# Patient Record
Sex: Female | Born: 1980 | Race: White | Hispanic: Yes | Marital: Married | Smoking: Current every day smoker
Health system: Southern US, Community
[De-identification: ages and names within clinical notes are randomized; demographics above are authoritative.]

---
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
## 1998-02-22 ENCOUNTER — Inpatient Hospital Stay (HOSPITAL_COMMUNITY): Admission: RE | Admit: 1998-02-22 | Discharge: 1998-02-22 | Payer: Self-pay | Admitting: *Deleted

## 1998-03-01 ENCOUNTER — Ambulatory Visit (HOSPITAL_COMMUNITY): Admission: RE | Admit: 1998-03-01 | Discharge: 1998-03-01 | Payer: Self-pay | Admitting: Obstetrics

## 1998-03-08 ENCOUNTER — Inpatient Hospital Stay (HOSPITAL_COMMUNITY): Admission: RE | Admit: 1998-03-08 | Discharge: 1998-03-13 | Payer: Self-pay | Admitting: Obstetrics & Gynecology

## 1998-03-13 ENCOUNTER — Encounter: Payer: Self-pay | Admitting: Obstetrics & Gynecology

## 1998-03-16 ENCOUNTER — Encounter (HOSPITAL_COMMUNITY): Admission: RE | Admit: 1998-03-16 | Discharge: 1998-03-30 | Payer: Self-pay | Admitting: *Deleted

## 1998-03-23 ENCOUNTER — Encounter: Payer: Self-pay | Admitting: *Deleted

## 1998-03-26 ENCOUNTER — Inpatient Hospital Stay (HOSPITAL_COMMUNITY): Admission: AD | Admit: 1998-03-26 | Discharge: 1998-03-26 | Payer: Self-pay | Admitting: *Deleted

## 1998-03-30 ENCOUNTER — Inpatient Hospital Stay (HOSPITAL_COMMUNITY): Admission: AD | Admit: 1998-03-30 | Discharge: 1998-04-03 | Payer: Self-pay | Admitting: Obstetrics

## 1998-04-03 ENCOUNTER — Encounter (HOSPITAL_COMMUNITY): Admission: RE | Admit: 1998-04-03 | Discharge: 1998-07-02 | Payer: Self-pay | Admitting: *Deleted

## 1998-10-05 ENCOUNTER — Other Ambulatory Visit: Admission: RE | Admit: 1998-10-05 | Discharge: 1998-10-05 | Payer: Self-pay | Admitting: Obstetrics

## 1998-10-08 ENCOUNTER — Other Ambulatory Visit: Admission: RE | Admit: 1998-10-08 | Discharge: 1998-10-08 | Payer: Self-pay | Admitting: Obstetrics

## 1999-02-14 ENCOUNTER — Encounter: Payer: Self-pay | Admitting: Emergency Medicine

## 1999-02-14 ENCOUNTER — Emergency Department (HOSPITAL_COMMUNITY): Admission: EM | Admit: 1999-02-14 | Discharge: 1999-02-14 | Payer: Self-pay | Admitting: Emergency Medicine

## 2005-09-16 ENCOUNTER — Ambulatory Visit (HOSPITAL_COMMUNITY): Admission: RE | Admit: 2005-09-16 | Discharge: 2005-09-16 | Payer: Self-pay | Admitting: Obstetrics and Gynecology

## 2016-06-19 ENCOUNTER — Emergency Department (HOSPITAL_BASED_OUTPATIENT_CLINIC_OR_DEPARTMENT_OTHER)
Admission: EM | Admit: 2016-06-19 | Discharge: 2016-06-19 | Disposition: A | Discharge status: Home / Self Care | Payer: Self-pay | Attending: Emergency Medicine | Admitting: Emergency Medicine

## 2016-06-19 ENCOUNTER — Encounter (HOSPITAL_BASED_OUTPATIENT_CLINIC_OR_DEPARTMENT_OTHER): Payer: Self-pay | Admitting: Emergency Medicine

## 2016-06-19 ENCOUNTER — Emergency Department (HOSPITAL_BASED_OUTPATIENT_CLINIC_OR_DEPARTMENT_OTHER): Payer: Self-pay

## 2016-06-19 DIAGNOSIS — F17200 Nicotine dependence, unspecified, uncomplicated: Secondary | ICD-10-CM | POA: Insufficient documentation

## 2016-06-19 DIAGNOSIS — R0789 Other chest pain: Secondary | ICD-10-CM | POA: Insufficient documentation

## 2016-06-19 DIAGNOSIS — R0602 Shortness of breath: Secondary | ICD-10-CM | POA: Insufficient documentation

## 2016-06-19 LAB — CBC WITH DIFFERENTIAL/PLATELET
BASOS ABS: 0 10*3/uL (ref 0.0–0.1)
Basophils Relative: 1 %
EOS ABS: 0.3 10*3/uL (ref 0.0–0.7)
EOS PCT: 4 %
HCT: 36.3 % (ref 36.0–46.0)
Hemoglobin: 12.7 g/dL (ref 12.0–15.0)
LYMPHS ABS: 1.8 10*3/uL (ref 0.7–4.0)
LYMPHS PCT: 30 %
MCH: 29.3 pg (ref 26.0–34.0)
MCHC: 35 g/dL (ref 30.0–36.0)
MCV: 83.8 fL (ref 78.0–100.0)
MONO ABS: 0.5 10*3/uL (ref 0.1–1.0)
Monocytes Relative: 8 %
Neutro Abs: 3.4 10*3/uL (ref 1.7–7.7)
Neutrophils Relative %: 57 %
PLATELETS: 322 10*3/uL (ref 150–400)
RBC: 4.33 MIL/uL (ref 3.87–5.11)
RDW: 12.7 % (ref 11.5–15.5)
WBC: 5.9 10*3/uL (ref 4.0–10.5)

## 2016-06-19 LAB — BASIC METABOLIC PANEL
ANION GAP: 12 (ref 5–15)
BUN: 8 mg/dL (ref 6–20)
CALCIUM: 8.7 mg/dL — AB (ref 8.9–10.3)
CO2: 23 mmol/L (ref 22–32)
CREATININE: 0.75 mg/dL (ref 0.44–1.00)
Chloride: 99 mmol/L — ABNORMAL LOW (ref 101–111)
Glucose, Bld: 183 mg/dL — ABNORMAL HIGH (ref 65–99)
Potassium: 3.8 mmol/L (ref 3.5–5.1)
Sodium: 134 mmol/L — ABNORMAL LOW (ref 135–145)

## 2016-06-19 LAB — TROPONIN I: Troponin I: <0.03 ng/mL (ref <0.03)

## 2016-06-19 LAB — D-DIMER, QUANTITATIVE (NOT AT ARMC): D DIMER QUANT: 0.9 ug{FEU}/mL — AB (ref 0.00–0.50)

## 2016-06-19 MED ORDER — IOPAMIDOL (ISOVUE-370) INJECTION 76%
100.0000 mL | Freq: Once | INTRAVENOUS | Status: AC | PRN
  Administered 2016-06-19: 100 mL via INTRAVENOUS

## 2016-06-19 MED ORDER — METHOCARBAMOL 500 MG PO TABS: 500.0000 mg | ORAL_TABLET | Freq: Two times a day (BID) | ORAL | 0 refills | Status: AC

## 2016-06-19 NOTE — ED Notes (Signed)
Patient transported to CT 

## 2016-06-19 NOTE — ED Provider Notes (Signed)
   Received signout from [facility] on shift change with CTA of chest pending. Plan is to discharge patient home with PCP follow-up Robaxin and anti-inflammatories if CT is negative for any PE.  CTA chest return reviewed. Negative for any acute pulmonary embolism.  Discussed results with patient. Instructed patient to follow up with her PCP in 2 days. Explained that we'll start her on Robaxin and anti-inflammatories for her pain.  Return precautions discussed. Patient expresses understanding and agreement to plan.        Maxwell Caul, PA-C 06/19/16 1912    Vanetta Mulders, MD 06/20/16 [phone]

## 2016-06-19 NOTE — ED Notes (Signed)
EMT attempted to draw blood on left forearm once. Unsuccessful attempt.

## 2016-06-19 NOTE — Discharge Instructions (Signed)
Begin taking Robaxin twice daily. Continue anti-inflammatories for pain control. Follow-up with PCP for further evaluation of symptoms. Return to ED for worsening chest pain, trouble breathing, coughing up blood, leg swelling, syncope or additional injury.

## 2016-06-19 NOTE — ED Provider Notes (Signed)
[facility] Provider Note   CSN: [id number] Arrival date & time: 06/19/16  1253     History   Chief Complaint Chief Complaint  Patient presents with  . Chest Pain    HPI Jody Greene is a 36 y.o. female.  HPI Patient presents with left-sided chest pain that radiates to her left shoulder up her left side of her neck and down her left arm. She states that the pain began 2 days ago and cannot recall any injury or muscle strain that could have caused the pain. She describes some mild shortness of breath with activity that she noticed yesterday. States that she has had a stressful week and does describe some anxiety related shortness of breath in the past. She states that the chest pain feels like a tightening that alleviates when pressure is applied to her chest. Also reports some "tingling feeling" in her left arm that comes and goes. No prior history of these symptoms. Denies past history of clots, cardiac history, fevers, leg swelling, recent surgery, recent prolonged travel, hemoptysis, or recent trauma, productive cough, vomiting, nausea, abdominal pain, urinary symptoms, syncope, headaches, lightheadedness appetite changes.  History reviewed. No pertinent past medical history.  There are no active problems to display for this patient.   History reviewed. No pertinent surgical history.  OB History    No data available       Home Medications    Prior to Admission medications   Not on File    Family History History reviewed. No pertinent family history.  Social History Social History  Substance Use Topics  . Smoking status: Current Every Day Smoker  . Smokeless tobacco: Never Used  . Alcohol use Yes     Comment: socially     Allergies   Patient has no known allergies.   Review of Systems Review of Systems  Constitutional: Negative for appetite change, chills and fever.  HENT: Negative for ear pain, rhinorrhea, sneezing and sore throat.     Eyes: Negative for photophobia and visual disturbance.  Respiratory: Positive for shortness of breath. Negative for cough, chest tightness and wheezing.   Cardiovascular: Positive for chest pain. Negative for palpitations and leg swelling.  Gastrointestinal: Negative for abdominal pain, blood in stool, constipation, diarrhea, nausea and vomiting.  Genitourinary: Negative for dysuria, hematuria and urgency.  Musculoskeletal: Positive for arthralgias and myalgias.  Skin: Negative for rash.  Neurological: Negative for dizziness, syncope, weakness, light-headedness and headaches.     Physical Exam Updated Vital Signs BP 131/88 (BP Location: Right Arm)   Pulse 68   Temp 98.1 F (36.7 C) (Oral)   Resp 18   Ht  (1.651 m)   Wt 96.2 kg   LMP 06/19/2016   SpO2 100%   BMI 35.28 kg/m   Physical Exam  Constitutional: She is oriented to person, place, and time. She appears well-developed and well-nourished. No distress.  HENT:  Head: Normocephalic and atraumatic.  Nose: Nose normal.  Eyes: Conjunctivae and EOM are normal. Left eye exhibits no discharge. No scleral icterus.  Neck: Normal range of motion. Neck supple.  Cardiovascular: Normal rate, regular rhythm, normal heart sounds and intact distal pulses.  Exam reveals no gallop and no friction rub.   No murmur heard. Pulmonary/Chest: Effort normal and breath sounds normal. No respiratory distress. She exhibits tenderness (On the left side with pain alleviated with palpation.).  Abdominal: Soft. Bowel sounds are normal. She exhibits no distension. There is no tenderness. There is no  guarding.  Musculoskeletal: Normal range of motion. She exhibits no edema.  No lower extremity edema, warmth, redness, calf tenderness present. Good pulses bilaterally.  Neurological: She is alert and oriented to person, place, and time. No sensory deficit. She exhibits normal muscle tone. Coordination normal.  Skin: Skin is warm and dry. No rash noted.   Psychiatric: She has a normal mood and affect.  Nursing note and vitals reviewed.    ED Treatments / Results  Labs (all labs ordered are listed, but only abnormal results are displayed) Labs Reviewed  BASIC METABOLIC PANEL - Abnormal; Notable for the following:       Result Value   Sodium 134 (*)    Chloride 99 (*)    Glucose, Bld 183 (*)    Calcium 8.7 (*)    All other components within normal limits  TROPONIN I  D-DIMER, QUANTITATIVE (NOT AT [facility])  CBC WITH DIFFERENTIAL/PLATELET    EKG  EKG Interpretation  Date/Time:  Thursday June 19 2016 13:02:36 EDT Ventricular Rate:  78 PR Interval:  158 QRS Duration: 74 QT Interval:  384 QTC Calculation: 437 R Axis:   73 Text Interpretation:  Normal sinus rhythm Normal ECG No previous tracing Reconfirmed by [email or web address]  MD, SCOTT [phone]) on 06/19/2016 3:35:27 PM       Radiology Dg Chest 2 View  Result Date: 06/19/2016 CLINICAL DATA:  Left side chest pain, left arm numbness for 2 days EXAM: CHEST  2 VIEW COMPARISON:  None. FINDINGS: Cardiomediastinal silhouette is unremarkable. No infiltrate or pleural effusion. No pulmonary edema. Minimal degenerative changes mid thoracic spine. IMPRESSION: No active cardiopulmonary disease. Electronically Signed   By: Natasha Mead M.D.   On: 06/19/2016 13:24    Procedures Procedures (including critical care time)  Medications Ordered in ED Medications - No data to display   Initial Impression / Assessment and Plan / ED Course  I have reviewed the triage vital signs and the nursing notes.  Pertinent labs & imaging results that were available during my care of the patient were reviewed by me and considered in my medical decision making (see chart for details).     Patient's history and symptoms concerning for musculoskeletal chest wall pain versus ACS versus pneumonia versus PE versus anxiety. Patient reports no cardiac history in the past, no history of clots. She is afebrile at this  time. This x-ray was negative for any acute cardiopulmonary process. Reports no productive cough or leg swelling. BMP revealed no electrolyte abnormalities. CBC unremarkable. Troponin was negative. EKG showed no acute changes including no ST elevation or evidence of arrhythmia. Because she does have some shortness of breath and chest pain and she does smoke, d-dimer was obtained and resulted as elevated. Will obtain CTA for evaluation of PE. If this is negative, symptoms are likely due to anxiety or musculoskeletal pain. We'll advise her to continue with anti-inflammatories and will write prescription for muscle relaxer to help with shoulder pain. Advised to follow up with PCP for further evaluation. Return precautions given.  CTA pending at end of shift. Care turned over to [facility], [place].  Final Clinical Impressions(s) / ED Diagnoses   Final diagnoses:  None    New Prescriptions New Prescriptions   No medications on file     Dietrich Pates, PA-C 06/19/16 1944    Vanetta Mulders, MD 06/20/16 [phone]

## 2016-06-19 NOTE — ED Notes (Signed)
Pt on monitor 

## 2016-06-19 NOTE — ED Triage Notes (Signed)
Patient states that she started to have pain to her left shoulder and "chest" region 2 days ago. The patient denies any other complaints  - today however she started to have numbness to her left hand

## 2018-07-08 IMAGING — CT CT ANGIO CHEST
2 of 9 series · 19 of 36 positions shown · IV contrast (isovue)
Comparison: Chest radiographs earlier this day.

CLINICAL DATA: Left-sided chest pain. Shortness of breath. Elevated
D-dimer.

EXAM:
CT ANGIOGRAPHY CHEST WITH CONTRAST
TECHNIQUE: Multidetector CT imaging of the chest was performed using the
standard protocol during bolus administration of intravenous
contrast. Multiplanar CT image reconstructions and MIPs were
obtained to evaluate the vascular anatomy.
CONTRAST:  100 cc Isovue 370 IV

[Series 7: pe thins · axial · 0.75mm/px · z∈[-268,-33]mm · 18 of 263 slices shown]
[im 14/263  lung]
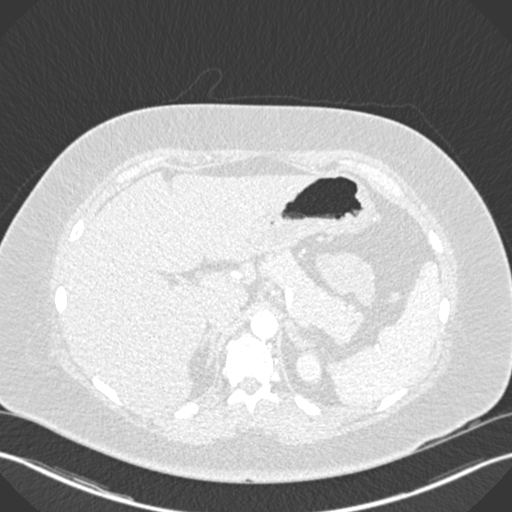
[im 28/263  mediastinal]
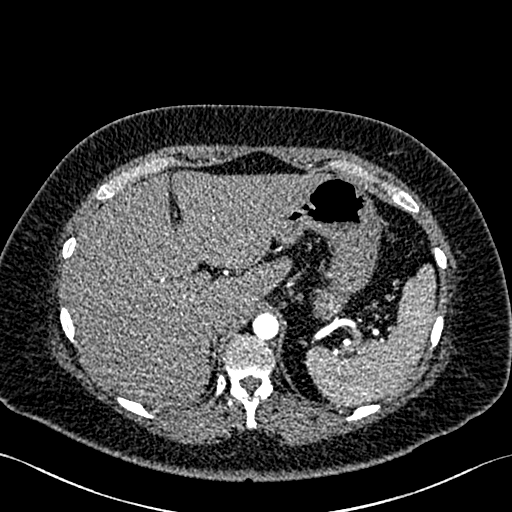
[im 42/263  lung]
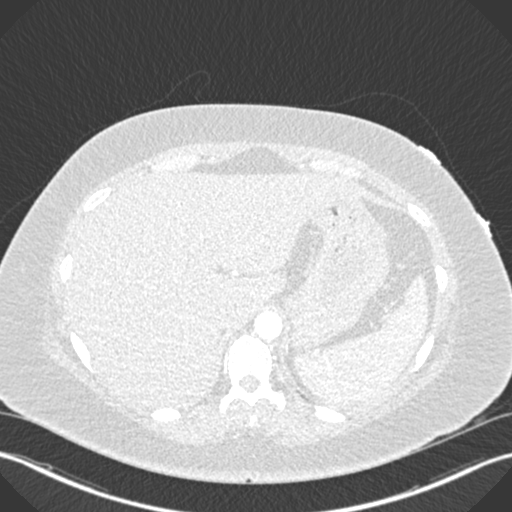
[im 56/263  mediastinal]
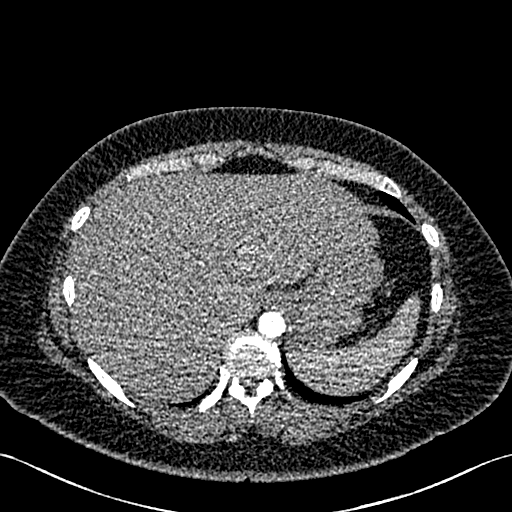
[im 69/263  lung]
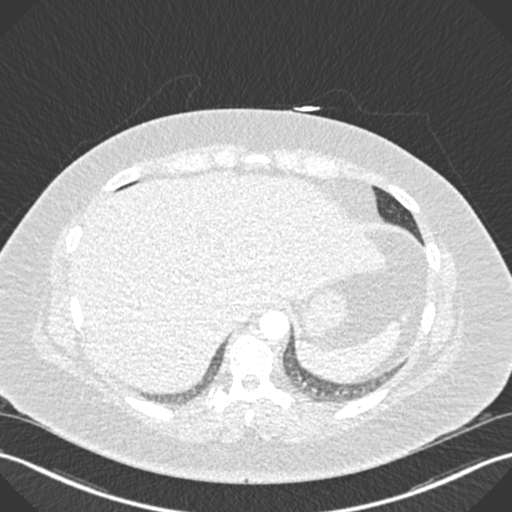
[im 83/263  mediastinal]
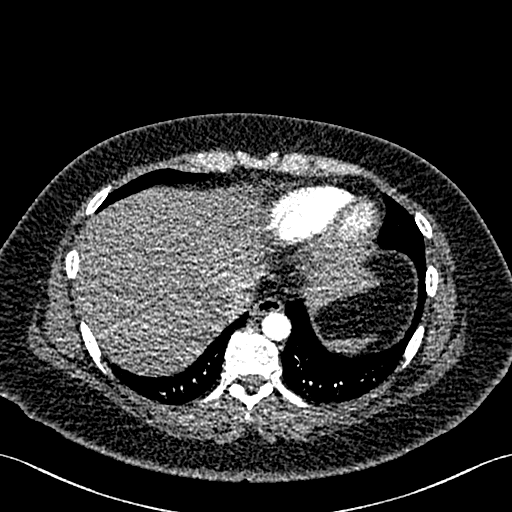
[im 97/263  lung]
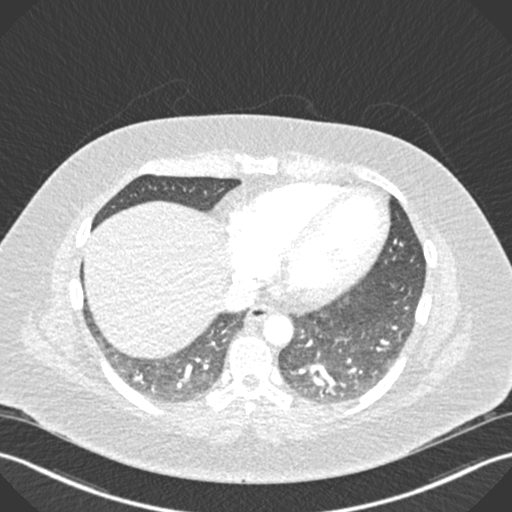
[im 111/263  mediastinal]
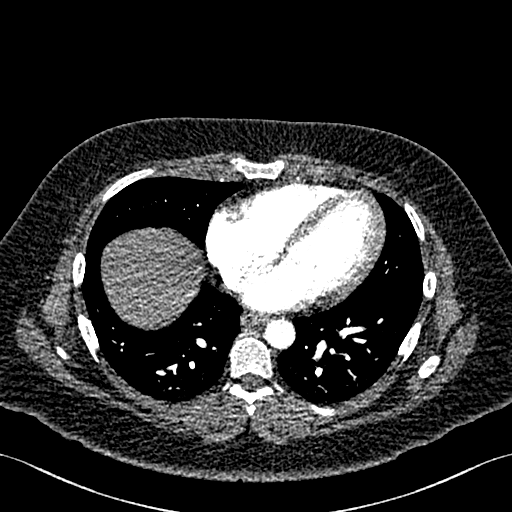
[im 125/263  lung]
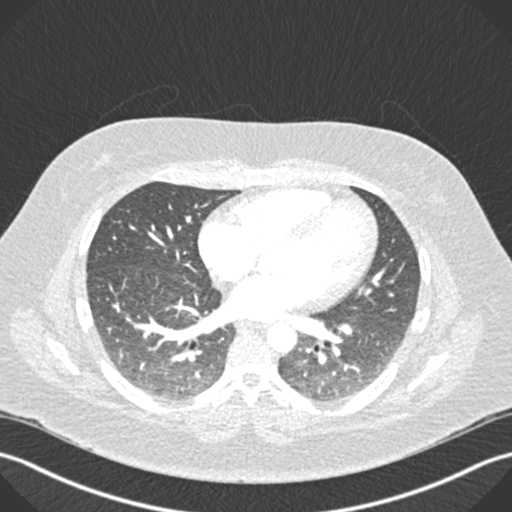
[im 138/263  mediastinal]
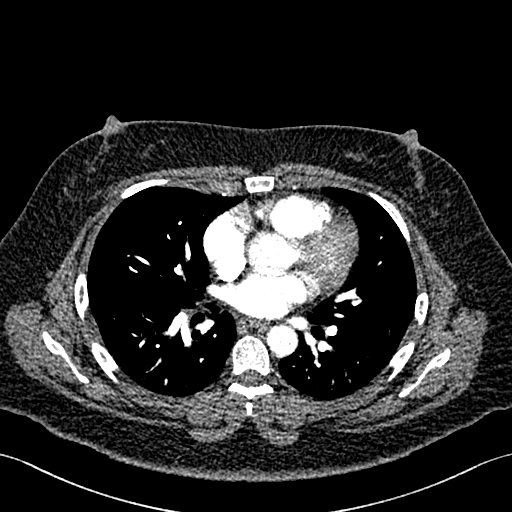
[im 152/263  lung]
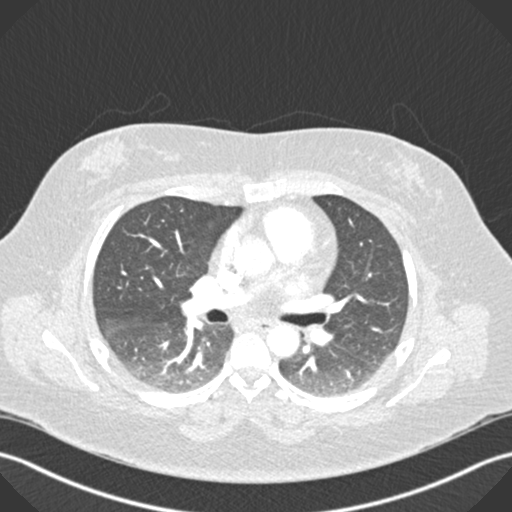
[im 166/263  mediastinal]
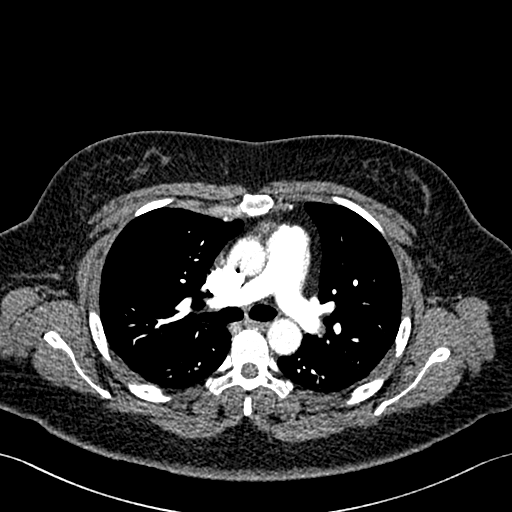
[im 180/263  lung]
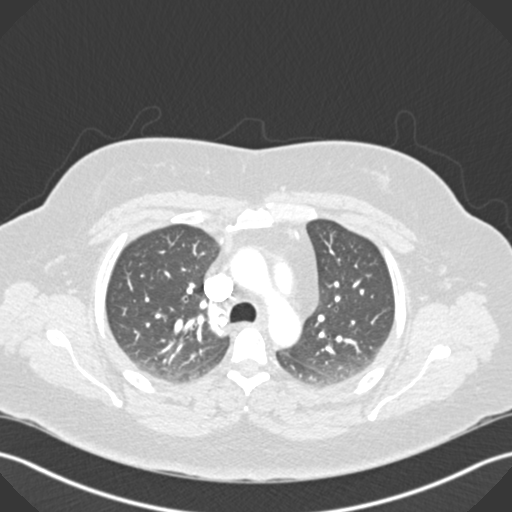
[im 194/263  mediastinal]
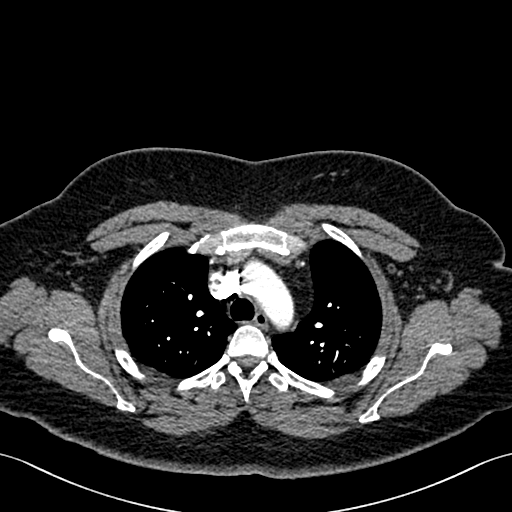
[im 207/263  lung]
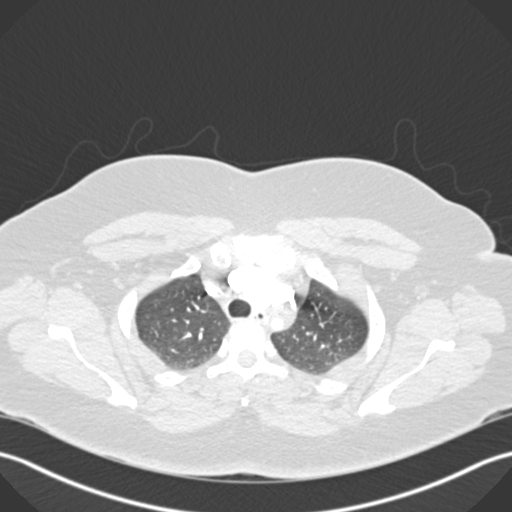
[im 221/263  mediastinal]
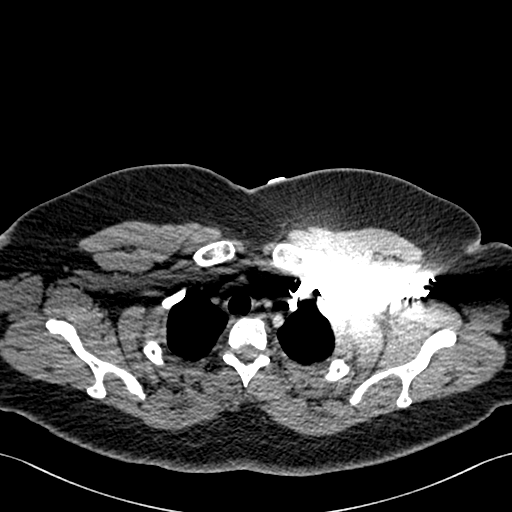
[im 235/263  lung]
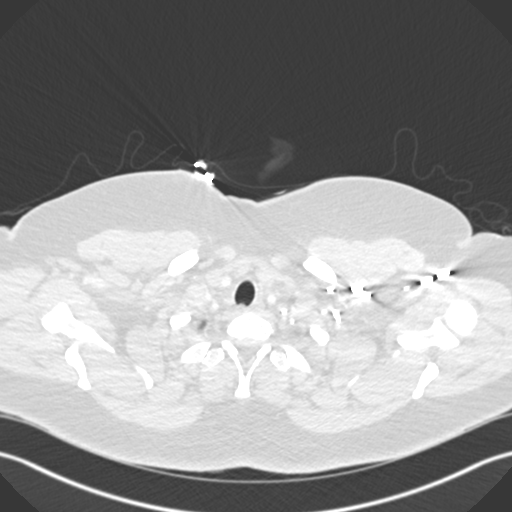
[im 249/263  mediastinal]
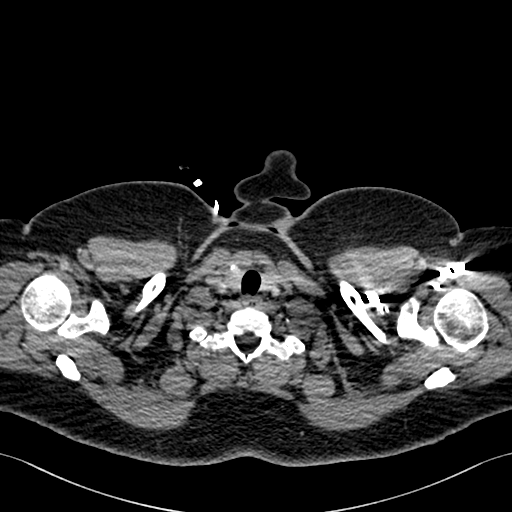

[Series 8: pe coronal mpr · coronal · 0.59mm/px · 1 of 120 slices shown]
[im 60/120  mediastinal]
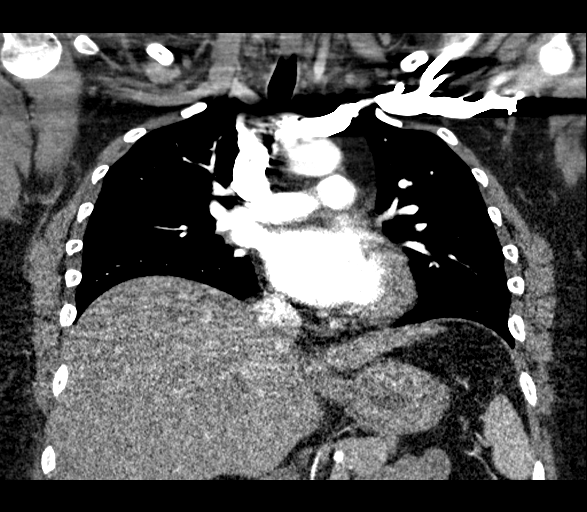

[19 of 36 positions shown; findings below may reference images not displayed]

FINDINGS: Cardiovascular: There are no filling defects within the pulmonary
arteries to suggest pulmonary embolus. Thoracic aorta is normal in
caliber without dissection. Normal heart size.

Mediastinum/Nodes: No mediastinal, hilar, or axillary adenopathy. No
pericardial fluid. The esophagus decompressed. Calcifications in the
right lobe of the thyroid gland without CT defined nodule.

Lungs/Pleura: Mild central airway thickening. No consolidation. No
pulmonary edema. No pleural fluid. No pulmonary mass or suspicious
nodule.

Upper Abdomen: No acute abnormality.

Musculoskeletal: There are no acute or suspicious osseous
abnormalities. Minimal endplate spurring in the thoracic spine.

Review of the MIP images confirms the above findings.
IMPRESSION: 1. No pulmonary embolus.
2. Mild central airway thickening, can be seen with bronchitis,
asthma, or smoking.

## 2018-07-08 IMAGING — CR DG CHEST 2V
2 series · 2 of 2 positions shown · non-contrast
Comparison: None.

CLINICAL DATA: Left side chest pain, left arm numbness for 2 days

EXAM:
CHEST  2 VIEW

[w chest pa]
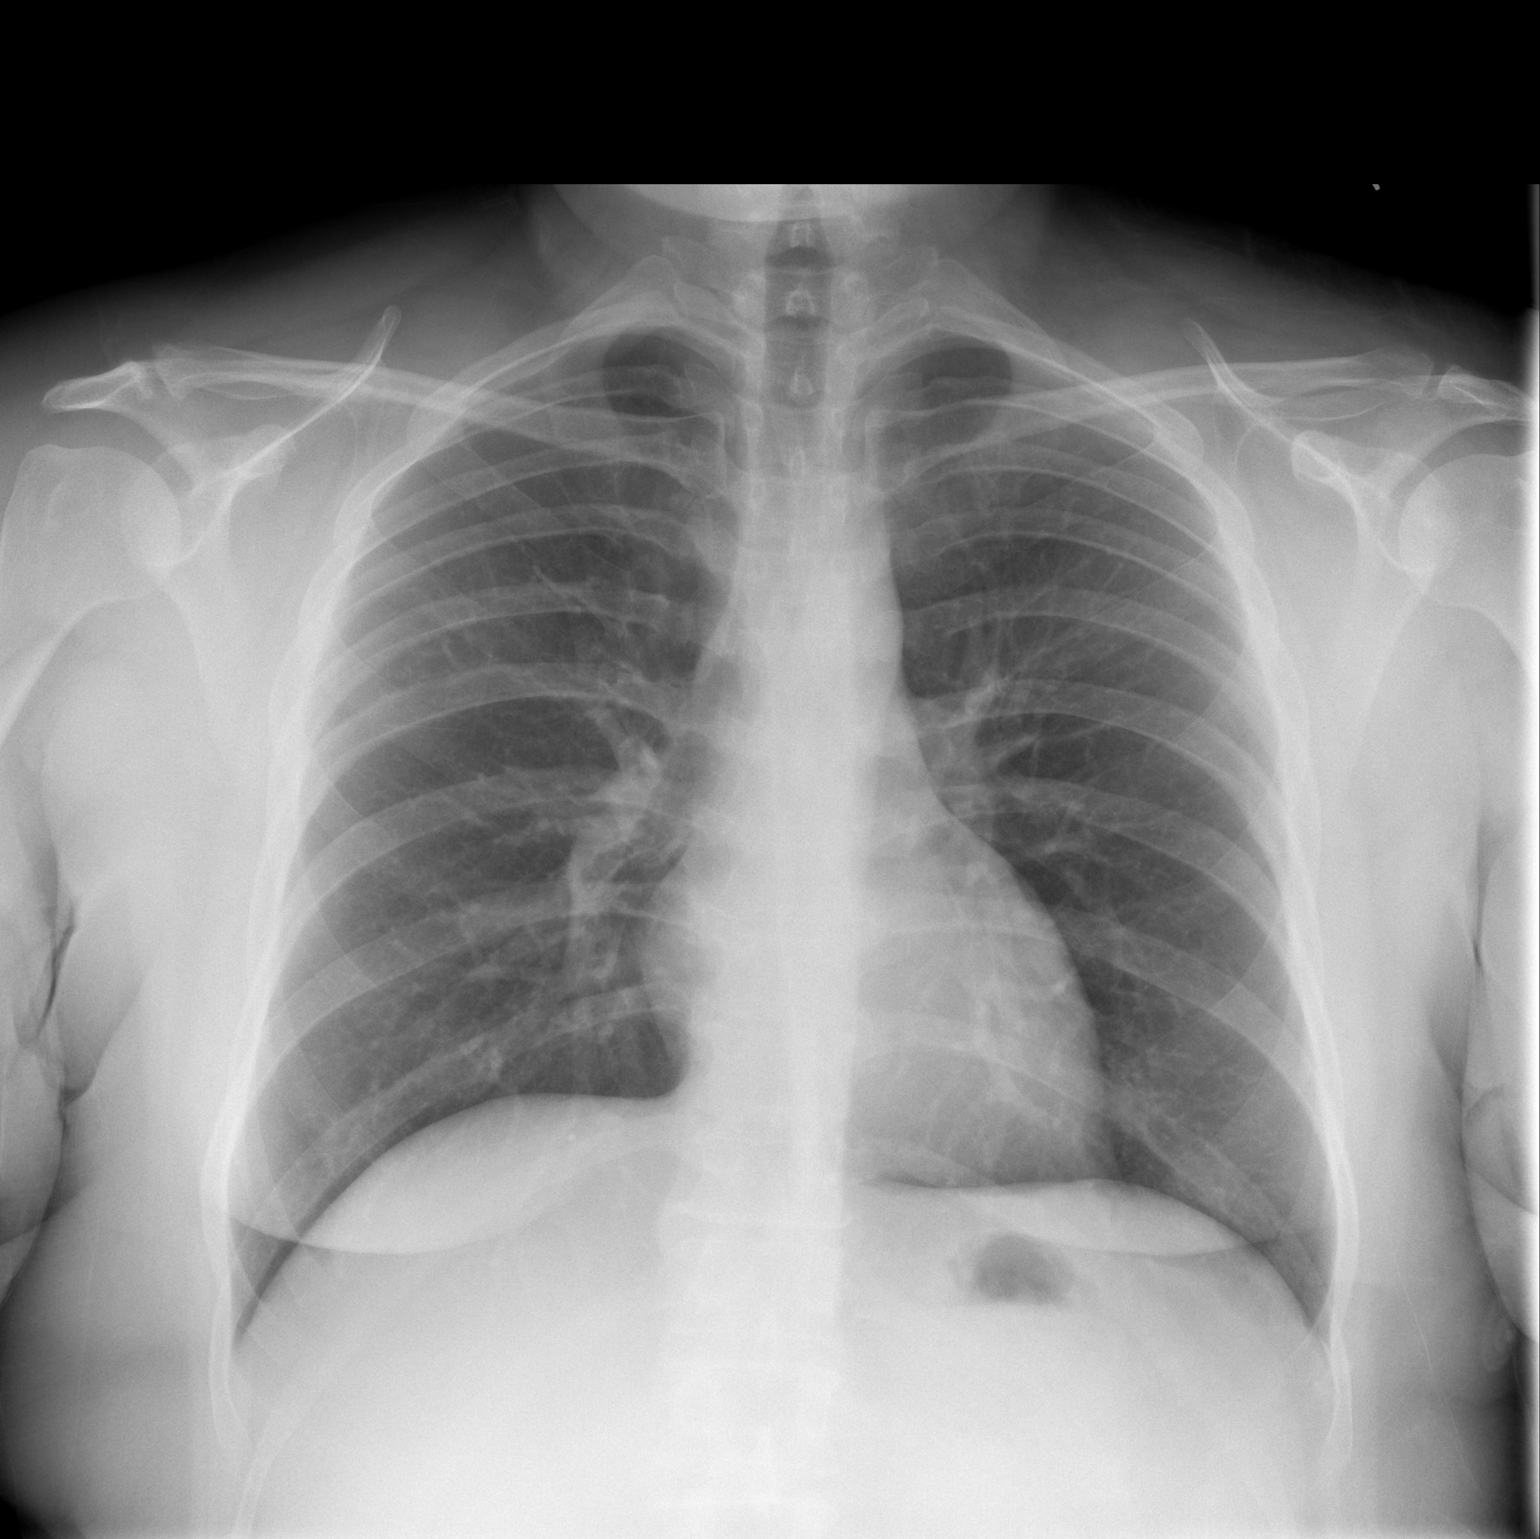

[w chest lat]
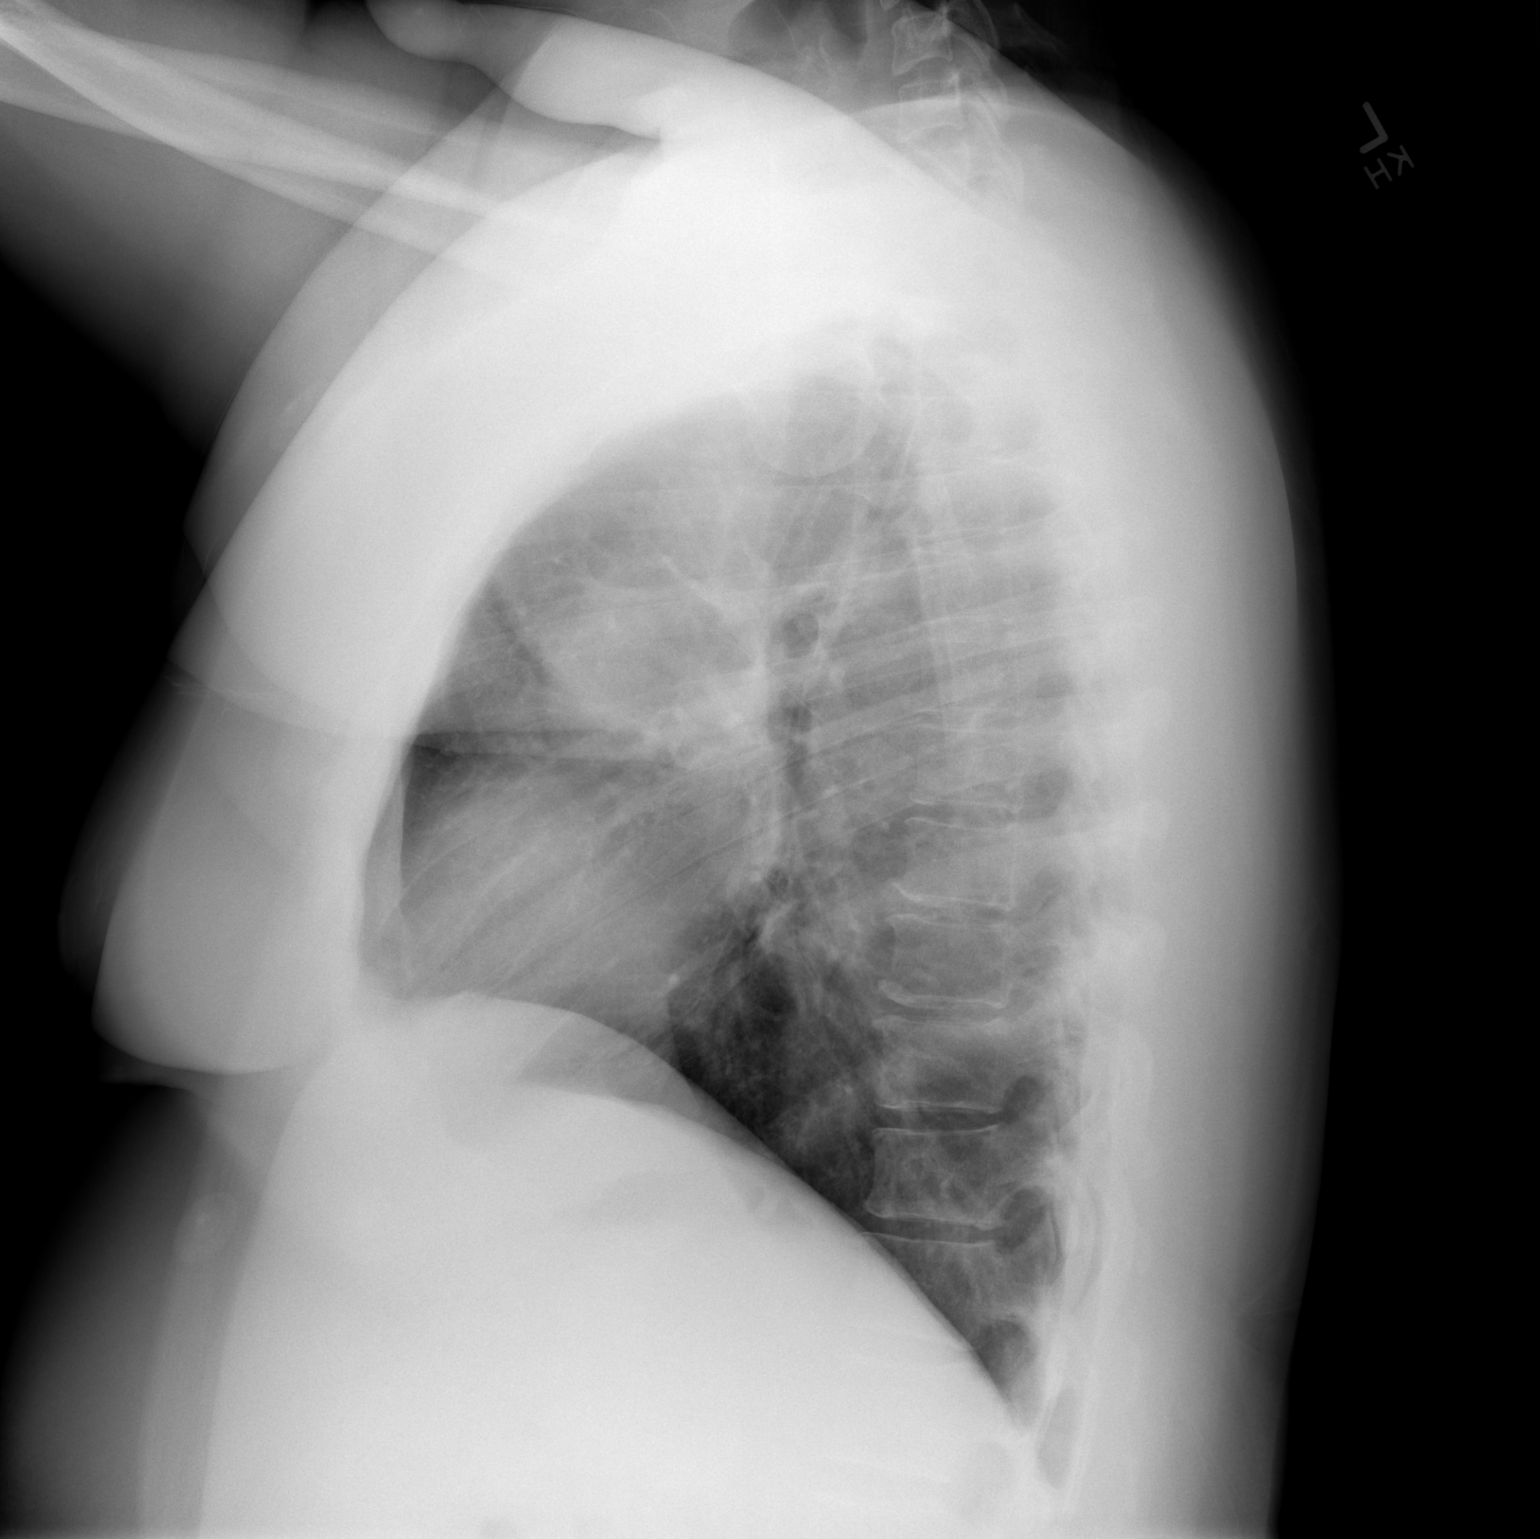

[2 of 2 positions shown; findings below may reference images not displayed]

FINDINGS: Cardiomediastinal silhouette is unremarkable. No infiltrate or
pleural effusion. No pulmonary edema. Minimal degenerative changes
mid thoracic spine.
IMPRESSION: No active cardiopulmonary disease.
